# Patient Record
Sex: Male | Born: 1975 | Race: White | Hispanic: No | Marital: Single | Smoking: Current every day smoker
Health system: Southern US, Community
[De-identification: ages and names within clinical notes are randomized; demographics above are authoritative.]

## PROBLEM LIST (undated history)

## (undated) DIAGNOSIS — J45909 Unspecified asthma, uncomplicated: Secondary | ICD-10-CM

---

## 1998-03-19 ENCOUNTER — Emergency Department (HOSPITAL_COMMUNITY): Admission: EM | Admit: 1998-03-19 | Discharge: 1998-03-19 | Payer: Self-pay | Admitting: Emergency Medicine

## 2002-01-02 ENCOUNTER — Encounter: Payer: Self-pay | Admitting: Emergency Medicine

## 2002-01-02 ENCOUNTER — Emergency Department (HOSPITAL_COMMUNITY): Admission: EM | Admit: 2002-01-02 | Discharge: 2002-01-02 | Payer: Self-pay | Admitting: Emergency Medicine

## 2005-04-18 ENCOUNTER — Emergency Department: Payer: Self-pay | Admitting: Emergency Medicine

## 2005-11-05 ENCOUNTER — Emergency Department (HOSPITAL_COMMUNITY): Admission: EM | Admit: 2005-11-05 | Discharge: 2005-11-06 | Payer: Self-pay | Admitting: Emergency Medicine

## 2007-07-31 ENCOUNTER — Emergency Department (HOSPITAL_COMMUNITY): Admission: EM | Admit: 2007-07-31 | Discharge: 2007-07-31 | Payer: Self-pay | Admitting: Emergency Medicine

## 2010-02-16 ENCOUNTER — Emergency Department (HOSPITAL_COMMUNITY): Admission: EM | Admit: 2010-02-16 | Discharge: 2010-02-17 | Payer: Self-pay | Admitting: Emergency Medicine

## 2010-02-23 ENCOUNTER — Emergency Department (HOSPITAL_COMMUNITY): Admission: EM | Admit: 2010-02-23 | Discharge: 2010-02-23 | Payer: Self-pay | Admitting: Emergency Medicine

## 2010-03-18 ENCOUNTER — Emergency Department (HOSPITAL_COMMUNITY): Admission: EM | Admit: 2010-03-18 | Discharge: 2010-03-18 | Payer: Self-pay | Admitting: Emergency Medicine

## 2010-05-31 ENCOUNTER — Emergency Department (HOSPITAL_COMMUNITY): Admission: EM | Admit: 2010-05-31 | Discharge: 2010-06-01 | Payer: Self-pay | Admitting: Emergency Medicine

## 2010-06-14 ENCOUNTER — Emergency Department (HOSPITAL_COMMUNITY): Admission: EM | Admit: 2010-06-14 | Discharge: 2010-06-14 | Payer: Self-pay | Admitting: Emergency Medicine

## 2010-07-16 ENCOUNTER — Inpatient Hospital Stay (HOSPITAL_COMMUNITY): Admission: EM | Admit: 2010-07-16 | Discharge: 2010-07-18 | Payer: Self-pay

## 2010-10-03 ENCOUNTER — Emergency Department (HOSPITAL_COMMUNITY)
Admission: EM | Admit: 2010-10-03 | Discharge: 2010-10-04 | Payer: Self-pay | Source: Home / Self Care | Admitting: Emergency Medicine

## 2011-01-03 LAB — BASIC METABOLIC PANEL
BUN: 10 mg/dL (ref 6–23)
BUN: 8 mg/dL (ref 6–23)
Calcium: 8 mg/dL — ABNORMAL LOW (ref 8.4–10.5)
Chloride: 108 mEq/L (ref 96–112)
Creatinine, Ser: 0.8 mg/dL (ref 0.4–1.5)
Potassium: 4.2 mEq/L (ref 3.5–5.1)
Sodium: 140 mEq/L (ref 135–145)

## 2011-01-03 LAB — CBC
HCT: 44.8 % (ref 39.0–52.0)
HCT: 47.3 % (ref 39.0–52.0)
Hemoglobin: 16.8 g/dL (ref 13.0–17.0)
MCH: 32.2 pg (ref 26.0–34.0)
MCH: 32.7 pg (ref 26.0–34.0)
MCV: 92 fL (ref 78.0–100.0)
Platelets: 211 10*3/uL (ref 150–400)
Platelets: 227 10*3/uL (ref 150–400)
RBC: 5.14 MIL/uL (ref 4.22–5.81)
RDW: 12.6 % (ref 11.5–15.5)

## 2011-01-03 LAB — DIFFERENTIAL
Eosinophils Absolute: 0.2 10*3/uL (ref 0.0–0.7)
Eosinophils Relative: 3 % (ref 0–5)
Neutrophils Relative %: 56 % (ref 43–77)

## 2011-01-03 LAB — ETHANOL: Alcohol, Ethyl (B): 280 mg/dL — ABNORMAL HIGH (ref 0–10)

## 2011-01-04 LAB — GLUCOSE, CAPILLARY: Glucose-Capillary: 100 mg/dL — ABNORMAL HIGH (ref 70–99)

## 2011-05-23 ENCOUNTER — Emergency Department (HOSPITAL_COMMUNITY): Payer: Self-pay

## 2011-05-23 ENCOUNTER — Emergency Department (HOSPITAL_COMMUNITY)
Admission: EM | Admit: 2011-05-23 | Discharge: 2011-05-23 | Disposition: A | Discharge status: Home / Self Care | Payer: Self-pay | Attending: Emergency Medicine | Admitting: Emergency Medicine

## 2011-05-23 DIAGNOSIS — S42009A Fracture of unspecified part of unspecified clavicle, initial encounter for closed fracture: Secondary | ICD-10-CM | POA: Insufficient documentation

## 2011-05-23 DIAGNOSIS — M25519 Pain in unspecified shoulder: Secondary | ICD-10-CM | POA: Insufficient documentation

## 2011-05-23 DIAGNOSIS — R51 Headache: Secondary | ICD-10-CM | POA: Insufficient documentation

## 2011-05-23 DIAGNOSIS — IMO0002 Reserved for concepts with insufficient information to code with codable children: Secondary | ICD-10-CM | POA: Insufficient documentation

## 2011-08-24 IMAGING — CT CT HEAD W/O CM
1 of 2 series · 16 of 30 positions shown, 20 images · non-contrast
Comparison: 07/15/2010.

CLINICAL DATA: Traumatic brain injury.  Subarachnoid hemorrhage.
Cervical strain.

CT HEAD WITHOUT CONTRAST
TECHNIQUE: Contiguous axial images were obtained from the base of
the skull through the vertex without contrast.

[Series 3: recon 2: brain · axial · 0.47mm/px · z∈[+130,+265]mm · 16 of 64 slices shown, 20 images]
[im 4/64  brain]
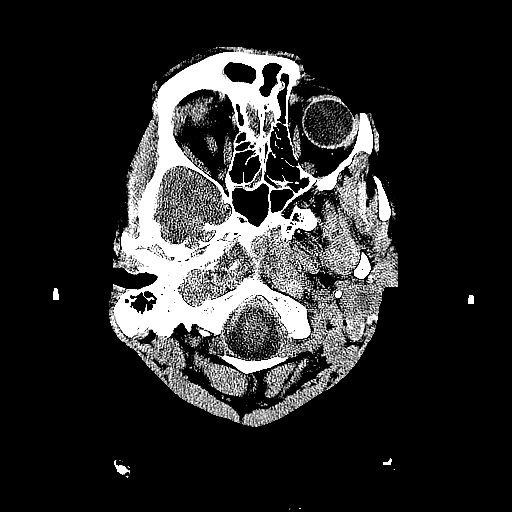
[im 4/64  bone]
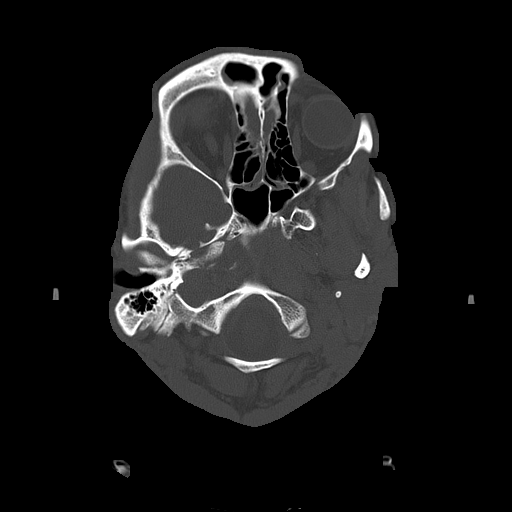
[im 7/64  brain]
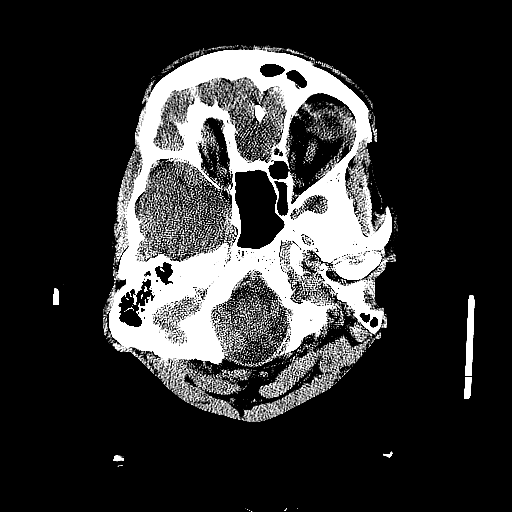
[im 10/64  brain]
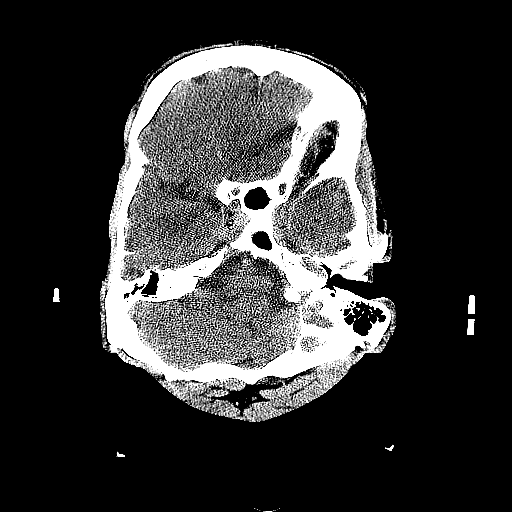
[im 14/64  brain]
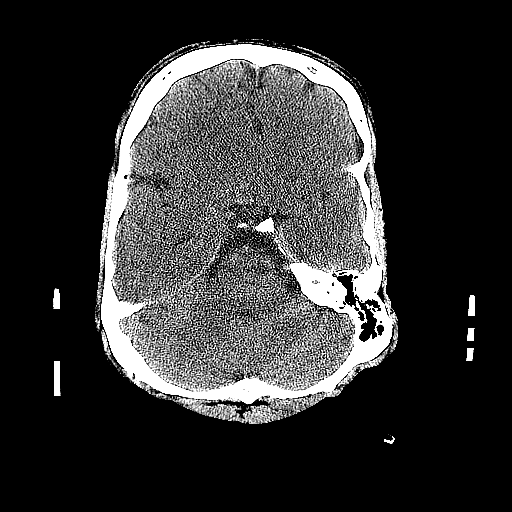
[im 17/64  brain]
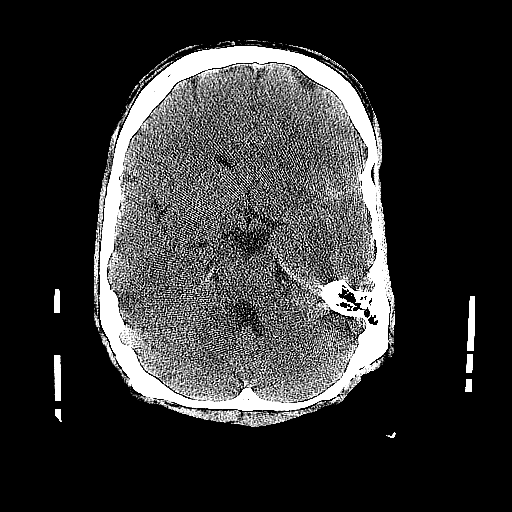
[im 17/64  bone]
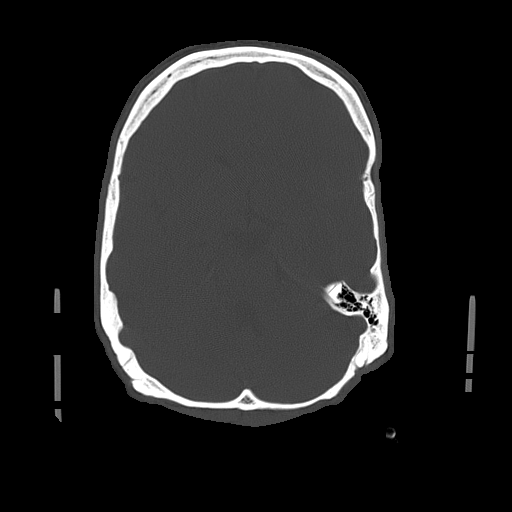
[im 20/64  brain]
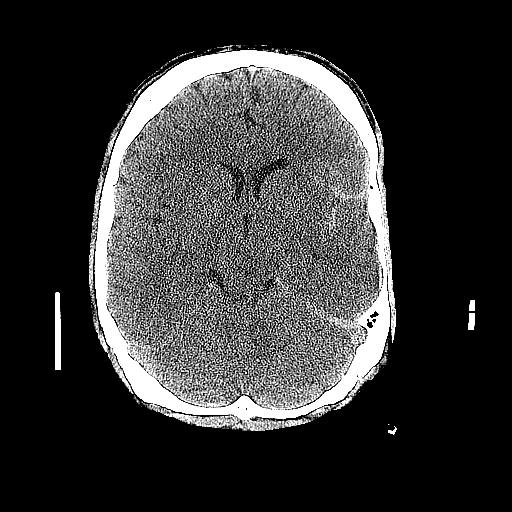
[im 24/64  brain]
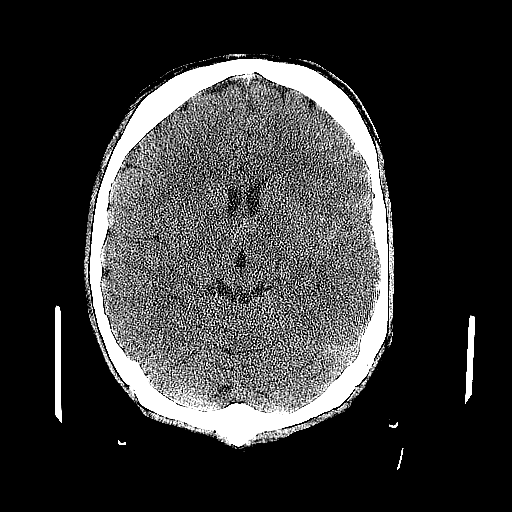
[im 27/64  brain]
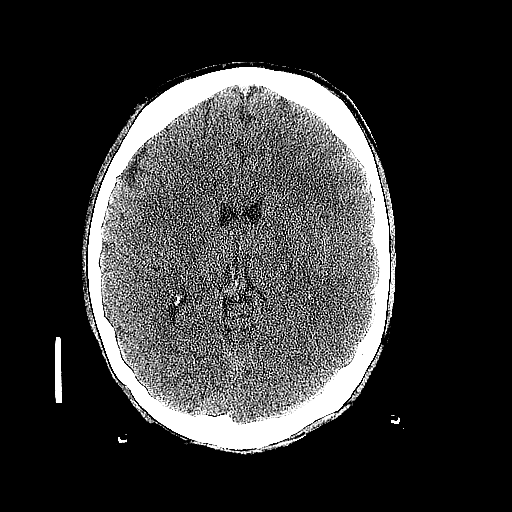
[im 34/64  brain]
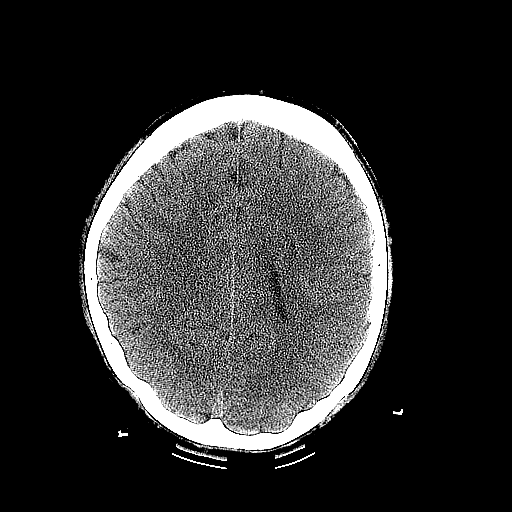
[im 34/64  bone]
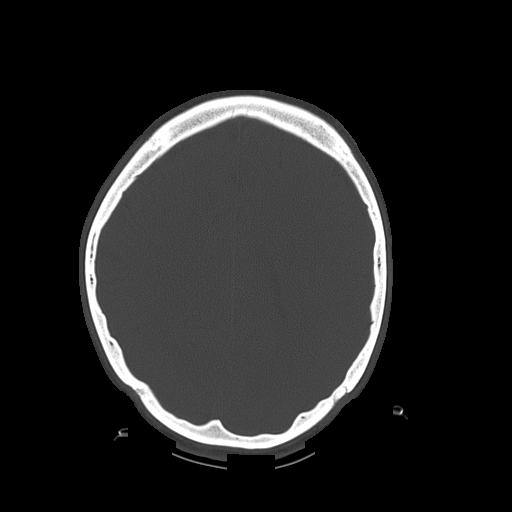
[im 37/64  brain]
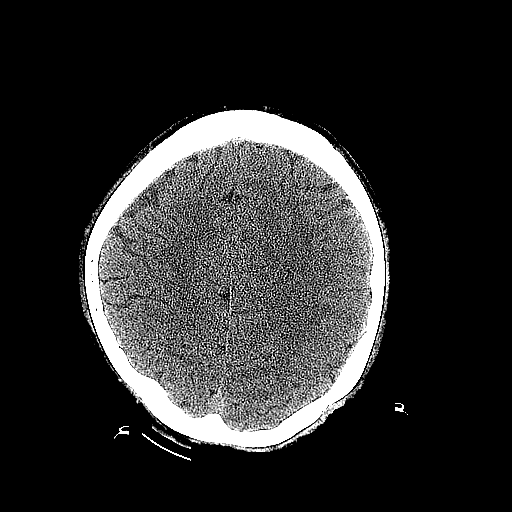
[im 40/64  brain]
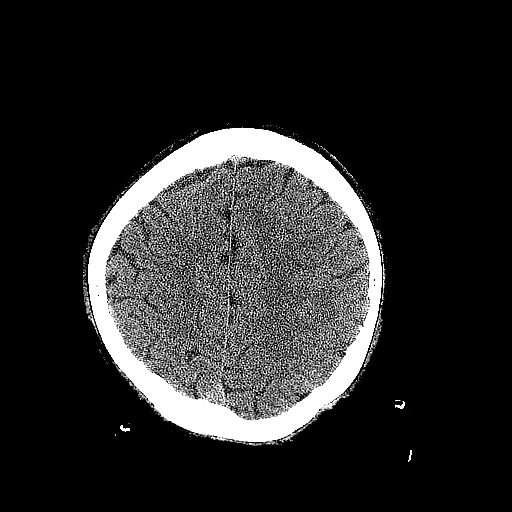
[im 44/64  brain]
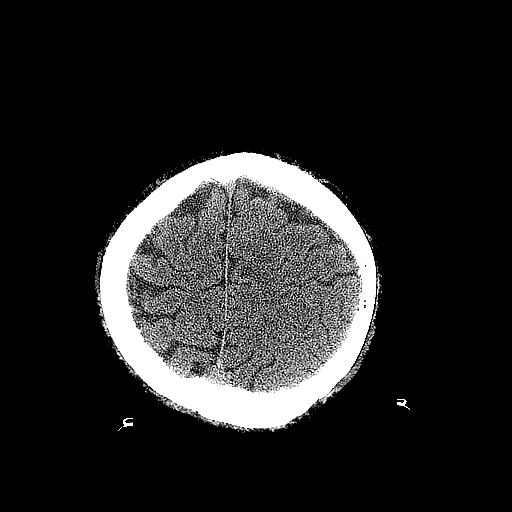
[im 47/64  brain]
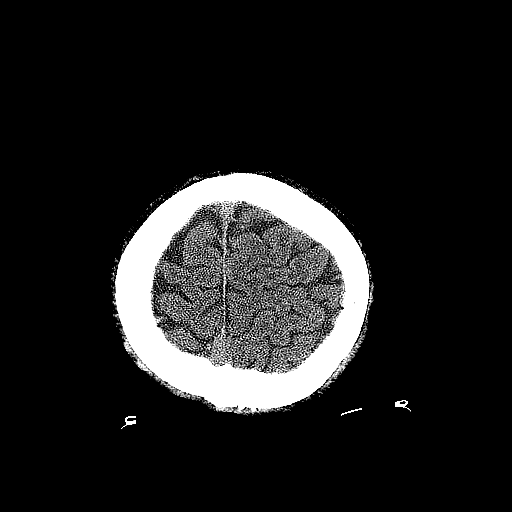
[im 47/64  bone]
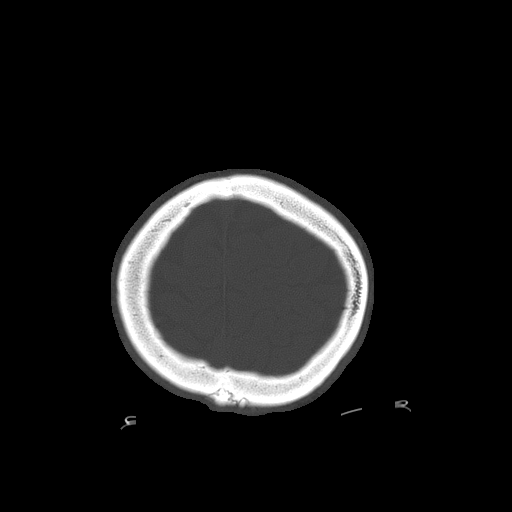
[im 50/64  brain]
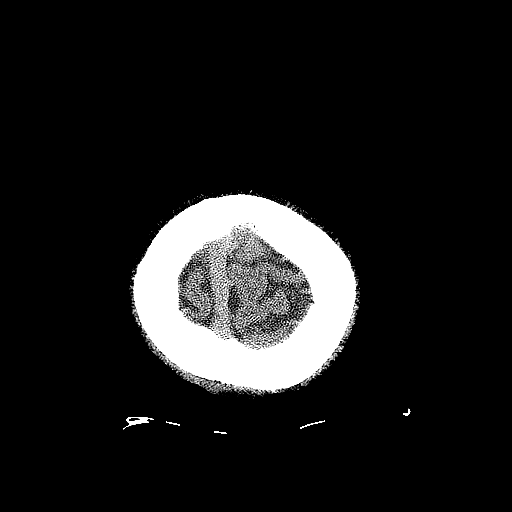
[im 54/64  brain]
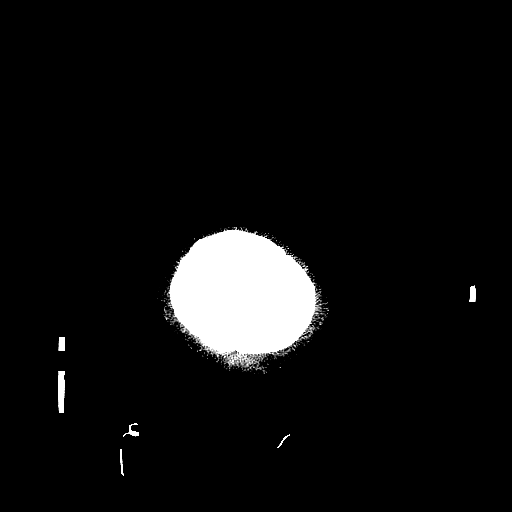
[im 57/64  brain]
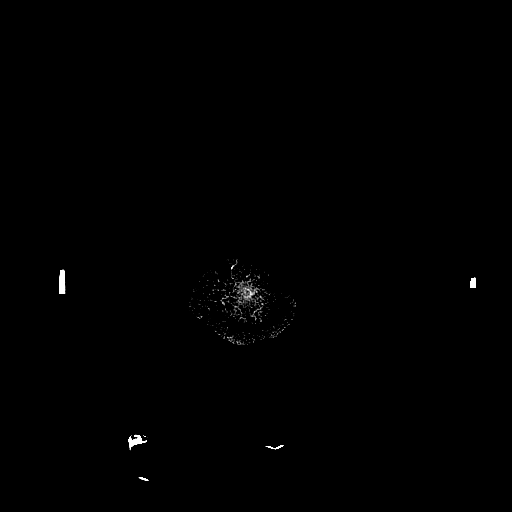

[16 of 30 positions shown; findings below may reference images not displayed]

FINDINGS: No interval change in left frontal and temporal
subarachnoid hemorrhage.  No hydrocephalus.  High attenuation is
present along the left tentorium, suggesting a small amount of
subdural hemorrhage.  Falx appears within normal limits.  No mass
lesion or mass effect.  No midline shift.  No skull fracture.
Scattered ethmoid sinus disease.
IMPRESSION: Unchanged left frontal and temporal subarachnoid hemorrhage.  High
attenuation has developed along the left tentorium, suggesting a
small amount of subdural hemorrhage.  No mass effect.

## 2011-08-26 IMAGING — CT CT HEAD W/O CM
2 series · 16 of 30 positions shown, 20 images · non-contrast
Comparison: [DATE]

CLINICAL DATA: Follow-up intracranial hemorrhage.  Closed head
injury.

CT HEAD WITHOUT CONTRAST
TECHNIQUE: Contiguous axial images were obtained from the base of
the skull through the vertex without contrast.

[Series 2: head w/o · axial · non-contrast · 0.41mm/px · z∈[+102,+222]mm · 13 of 30 slices shown, 17 images]
[im 3/30  brain]
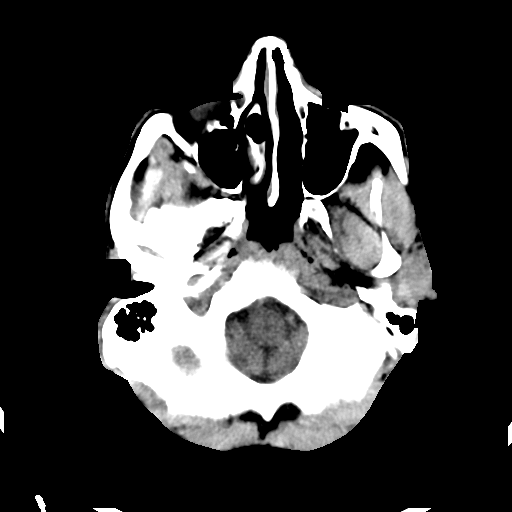
[im 3/30  bone]
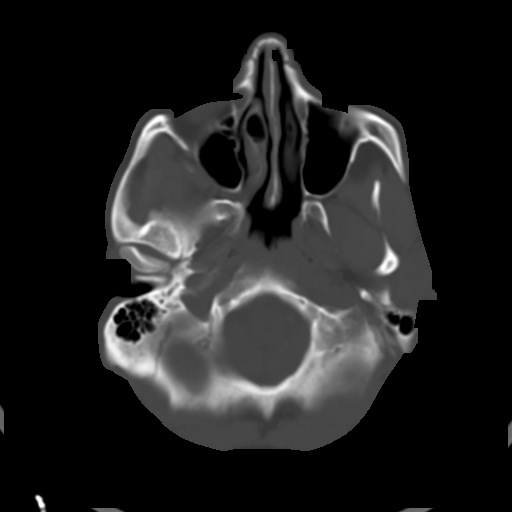
[im 5/30  brain]
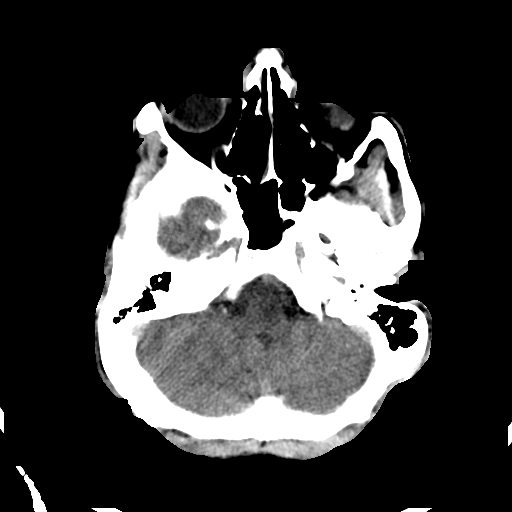
[im 7/30  brain]
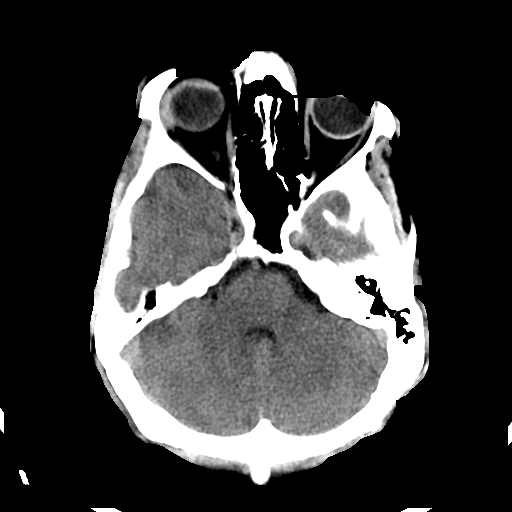
[im 9/30  brain]
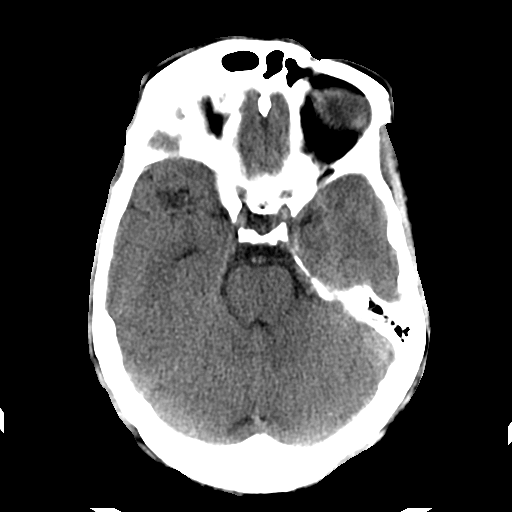
[im 11/30  brain]
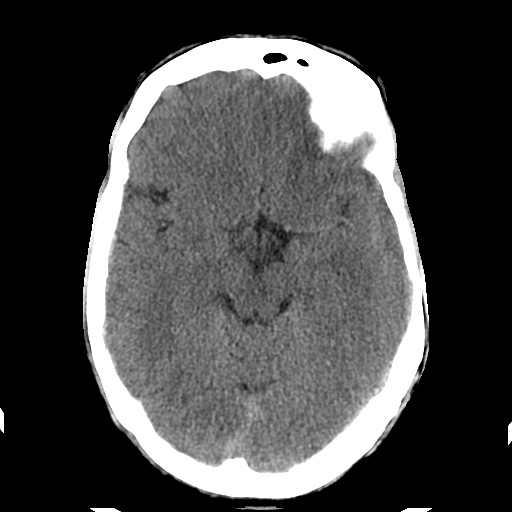
[im 11/30  bone]
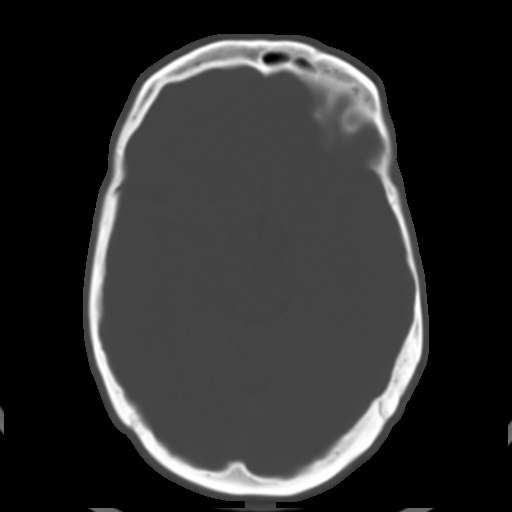
[im 13/30  brain]
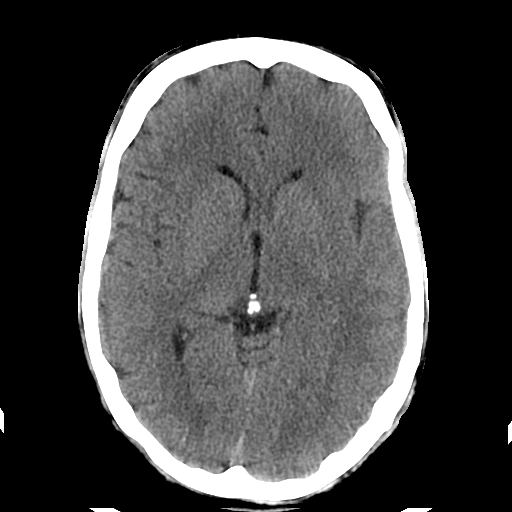
[im 15/30  brain]
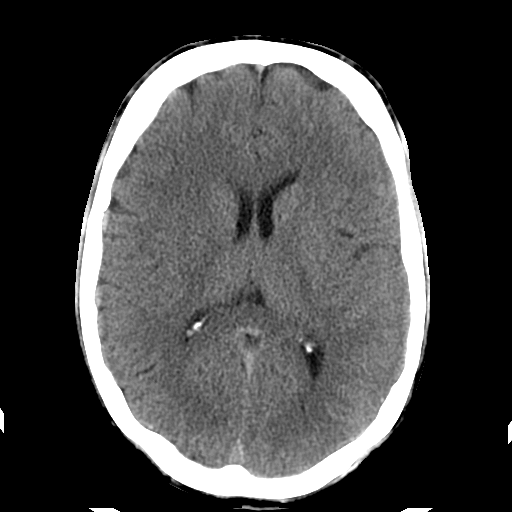
[im 17/30  brain]
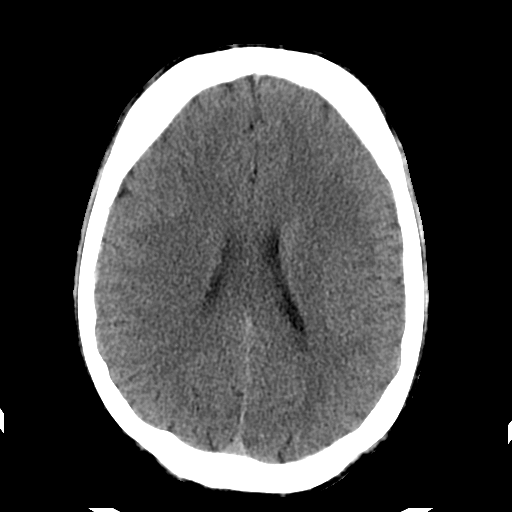
[im 19/30  brain]
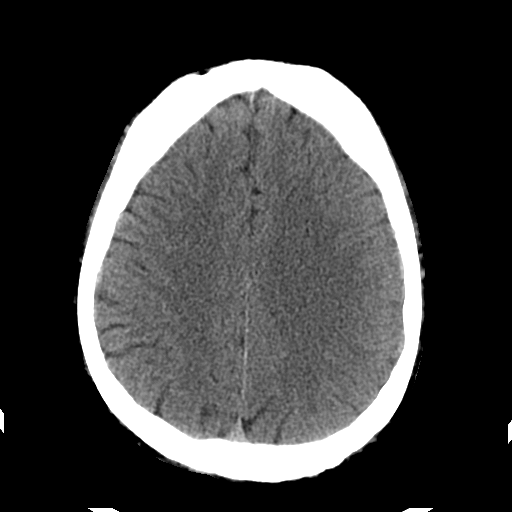
[im 19/30  bone]
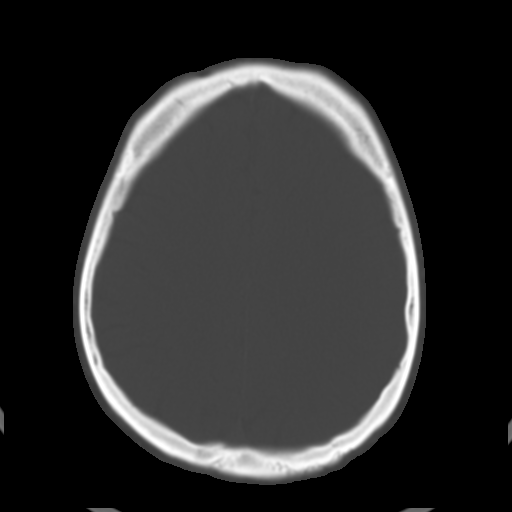
[im 21/30  brain]
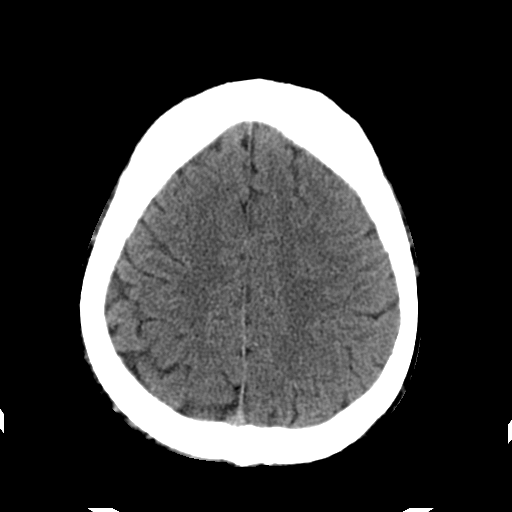
[im 23/30  brain]
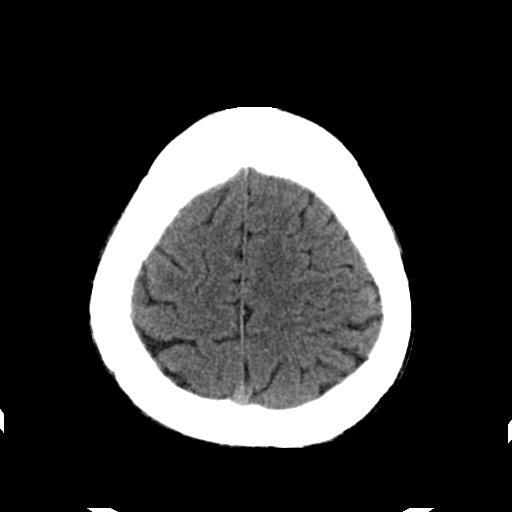
[im 25/30  brain]
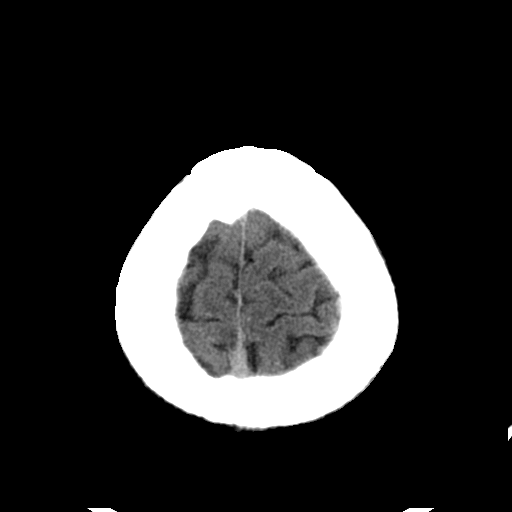
[im 27/30  brain]
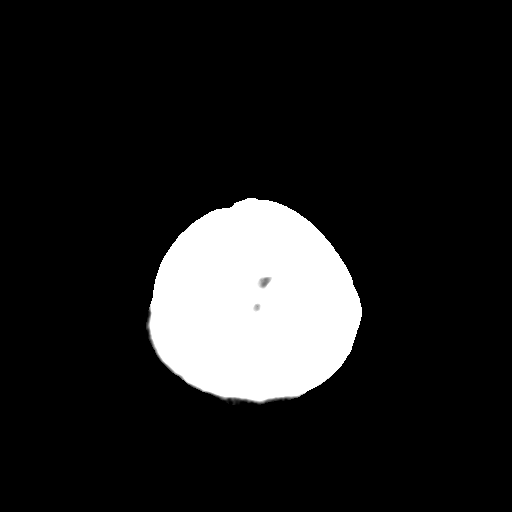
[im 27/30  bone]
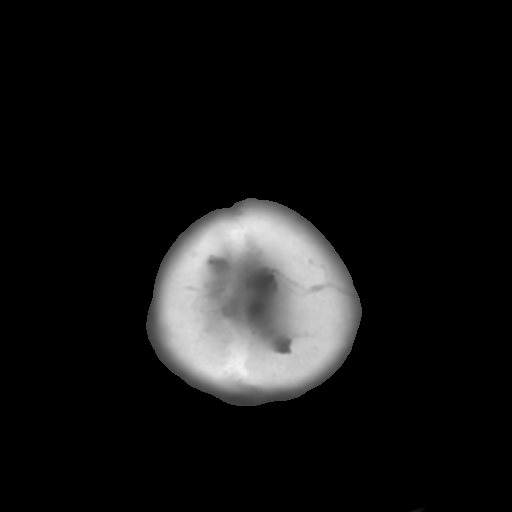

[Series 3: head w/o bone · axial · non-contrast · 0.41mm/px · z∈[+102,+142]mm · 3 of 30 slices shown]
[im 3/30  bone]
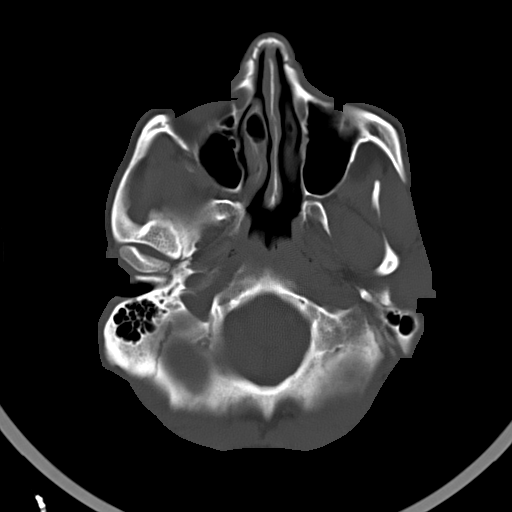
[im 7/30  bone]
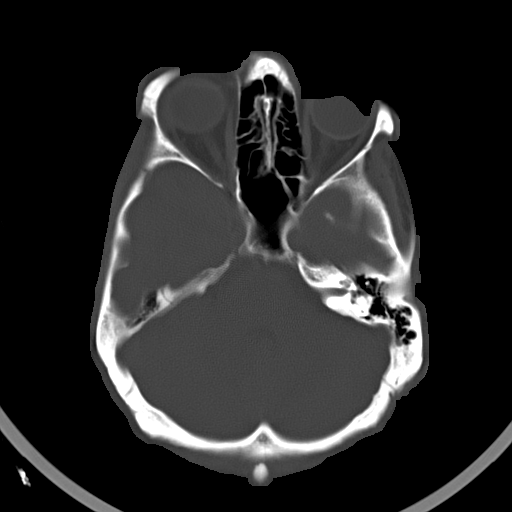
[im 11/30  bone]
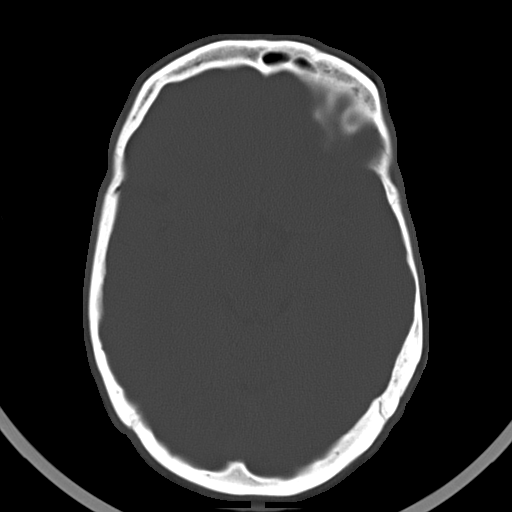

[16 of 30 positions shown; findings below may reference images not displayed]

FINDINGS: Hyperdense subarachnoid blood previously seen is no
longer detectable.  No visible subdural blood.  The brain does not
show swelling.  No focal infarction, mass lesion, hydrocephalus or
extra-axial collection.  No skull fracture.  Sinuses, middle ears
and mastoids are clear.
IMPRESSION: Normalization of the head CT.  Previously seen traumatic
subarachnoid hemorrhage on the left is no longer visible.  No
discernible subdural hematoma.

## 2013-04-14 ENCOUNTER — Emergency Department (HOSPITAL_COMMUNITY)
Admission: EM | Admit: 2013-04-14 | Discharge: 2013-04-15 | Discharge status: Against Medical Advice | Payer: Self-pay | Attending: Emergency Medicine | Admitting: Emergency Medicine

## 2013-04-14 ENCOUNTER — Emergency Department (HOSPITAL_COMMUNITY): Payer: Self-pay

## 2013-04-14 ENCOUNTER — Encounter (HOSPITAL_COMMUNITY): Payer: Self-pay | Admitting: Emergency Medicine

## 2013-04-14 DIAGNOSIS — R0989 Other specified symptoms and signs involving the circulatory and respiratory systems: Secondary | ICD-10-CM | POA: Insufficient documentation

## 2013-04-14 DIAGNOSIS — R630 Anorexia: Secondary | ICD-10-CM | POA: Insufficient documentation

## 2013-04-14 DIAGNOSIS — J988 Other specified respiratory disorders: Secondary | ICD-10-CM

## 2013-04-14 DIAGNOSIS — R5381 Other malaise: Secondary | ICD-10-CM | POA: Insufficient documentation

## 2013-04-14 DIAGNOSIS — B9789 Other viral agents as the cause of diseases classified elsewhere: Secondary | ICD-10-CM | POA: Insufficient documentation

## 2013-04-14 DIAGNOSIS — R5383 Other fatigue: Secondary | ICD-10-CM | POA: Insufficient documentation

## 2013-04-14 DIAGNOSIS — R0609 Other forms of dyspnea: Secondary | ICD-10-CM | POA: Insufficient documentation

## 2013-04-14 DIAGNOSIS — J989 Respiratory disorder, unspecified: Secondary | ICD-10-CM | POA: Insufficient documentation

## 2013-04-14 DIAGNOSIS — R509 Fever, unspecified: Secondary | ICD-10-CM | POA: Insufficient documentation

## 2013-04-14 DIAGNOSIS — R0602 Shortness of breath: Secondary | ICD-10-CM | POA: Insufficient documentation

## 2013-04-14 LAB — CBC WITH DIFFERENTIAL/PLATELET
Eosinophils Absolute: 0 10*3/uL (ref 0.0–0.7)
Eosinophils Relative: 0 % (ref 0–5)
Hemoglobin: 14.8 g/dL (ref 13.0–17.0)
Lymphs Abs: 1.2 10*3/uL (ref 0.7–4.0)
MCH: 33.8 pg (ref 26.0–34.0)
MCHC: 36.1 g/dL — ABNORMAL HIGH (ref 30.0–36.0)
MCV: 93.6 fL (ref 78.0–100.0)
Monocytes Relative: 10 % (ref 3–12)
RBC: 4.38 MIL/uL (ref 4.22–5.81)

## 2013-04-14 LAB — COMPREHENSIVE METABOLIC PANEL
ALT: 10 U/L (ref 0–53)
AST: 14 U/L (ref 0–37)
Albumin: 3 g/dL — ABNORMAL LOW (ref 3.5–5.2)
Alkaline Phosphatase: 66 U/L (ref 39–117)
CO2: 24 mEq/L (ref 19–32)
GFR calc non Af Amer: >90 mL/min (ref >90)
Total Bilirubin: 0.5 mg/dL (ref 0.3–1.2)

## 2013-04-14 LAB — D-DIMER, QUANTITATIVE: D-Dimer, Quant: <0.27 ug{FEU}/mL (ref 0.00–0.48)

## 2013-04-14 MED ORDER — IPRATROPIUM BROMIDE 0.02 % IN SOLN
0.5000 mg | Freq: Once | RESPIRATORY_TRACT | Status: AC
  Administered 2013-04-14: 0.5 mg via RESPIRATORY_TRACT
  Filled 2013-04-14: qty 2.5

## 2013-04-14 MED ORDER — PREDNISONE 20 MG PO TABS
60.0000 mg | ORAL_TABLET | Freq: Once | ORAL | Status: AC
  Administered 2013-04-14: 60 mg via ORAL
  Filled 2013-04-14: qty 3

## 2013-04-14 MED ORDER — IPRATROPIUM BROMIDE 0.02 % IN SOLN: 0.5000 mg | Freq: Two times a day (BID) | RESPIRATORY_TRACT | Status: DC

## 2013-04-14 MED ORDER — SODIUM CHLORIDE 0.9 % IV BOLUS (SEPSIS)
1000.0000 mL | Freq: Once | INTRAVENOUS | Status: AC
  Administered 2013-04-14: 1000 mL via INTRAVENOUS

## 2013-04-14 MED ORDER — ALBUTEROL SULFATE (5 MG/ML) 0.5% IN NEBU: 2.5000 mg | INHALATION_SOLUTION | Freq: Two times a day (BID) | RESPIRATORY_TRACT | Status: DC

## 2013-04-14 MED ORDER — ALBUTEROL SULFATE (5 MG/ML) 0.5% IN NEBU
5.0000 mg | INHALATION_SOLUTION | Freq: Once | RESPIRATORY_TRACT | Status: AC
  Administered 2013-04-14: 5 mg via RESPIRATORY_TRACT
  Filled 2013-04-14: qty 1

## 2013-04-14 MED ORDER — ACETAMINOPHEN 325 MG PO TABS
650.0000 mg | ORAL_TABLET | Freq: Once | ORAL | Status: AC
  Administered 2013-04-14: 650 mg via ORAL
  Filled 2013-04-14: qty 2

## 2013-04-14 MED ORDER — ONDANSETRON HCL 4 MG/2ML IJ SOLN
4.0000 mg | Freq: Once | INTRAMUSCULAR | Status: AC
  Administered 2013-04-14: 4 mg via INTRAVENOUS
  Filled 2013-04-14: qty 2

## 2013-04-14 MED ORDER — ALBUTEROL (5 MG/ML) CONTINUOUS INHALATION SOLN
10.0000 mg/h | INHALATION_SOLUTION | RESPIRATORY_TRACT | Status: AC
  Administered 2013-04-15: 10 mg/h via RESPIRATORY_TRACT
  Filled 2013-04-14: qty 20

## 2013-04-14 NOTE — ED Notes (Signed)
Dr. Rancour at bedside. 

## 2013-04-14 NOTE — ED Provider Notes (Signed)
Patient care assumed from Dr. Jeannett Senior Rancour at shift change with labs pending and plan to d/c if heart rate and SpO2 improve.  On my examination, patient is well and nontoxic appearing without tachypnea or dyspnea; heart RRR at rate of 98 and lungs with diffuse adventitious sounds and mild expiratory wheezes in LULF. Labs significant for mild leukocytosis of 16.6 consistent with viral process as chest x-ray shows no signs of pneumonia, pleural effusion, pneumothorax, or other acute cardiopulmonary changes. Troponin less than 0.30 and d-dimer within normal limits. Will administer 2nd neb tx, ambulate, and reassess.  Patient's SpO2 dropped to 89-92% with ambulation and tachy to 108. Will order continuous neb and reassess.  After continuous neb, pulse ox between 84-92% with ambulation. Have spoken to the patient regarding admission given hypoxia while ambulating. Patient states that he does not wish to be admitted and wants to go home. Have discussed with the patient that this is against my medical advice as I believe the patient requires admission for pulse ox monitoring and further breathing treatments. Patient still insistent on discharge; patient verbalizes understanding when told that premature discharge may result in adverse outcomes including, but not limited to, syncope or outcomes as severe as death. Patient again declines admission and states he would like to leave AMA. Albuterol inhaler and Rx for prednisone and doxycycline given.    Date: 04/14/2013  Rate: 129  Rhythm: sinus tachycardia  QRS Axis: normal  Intervals: normal  ST/T Wave abnormalities: nonspecific ST changes  Conduction Disutrbances:abnormal R progression in V2  Narrative Interpretation: Sinus tachycardia with nonspecific ST changes (II, III, aVR, aVL, aVF); no STEMI  Old EKG Reviewed: none available I have personally reviewed and interpreted this EKG    Results for orders placed during the hospital encounter of 04/14/13   CBC WITH DIFFERENTIAL      Result Value Range   WBC 16.6 (*) 4.0 - 10.5 K/uL   RBC 4.38  4.22 - 5.81 MIL/uL   Hemoglobin 14.8  13.0 - 17.0 g/dL   HCT 16.1  09.6 - 04.5 %   MCV 93.6  78.0 - 100.0 fL   MCH 33.8  26.0 - 34.0 pg   MCHC 36.1 (*) 30.0 - 36.0 g/dL   RDW 40.9  81.1 - 91.4 %   Platelets 141 (*) 150 - 400 K/uL   Neutrophils Relative % 83 (*) 43 - 77 %   Neutro Abs 13.7 (*) 1.7 - 7.7 K/uL   Lymphocytes Relative 7 (*) 12 - 46 %   Lymphs Abs 1.2  0.7 - 4.0 K/uL   Monocytes Relative 10  3 - 12 %   Monocytes Absolute 1.6 (*) 0.1 - 1.0 K/uL   Eosinophils Relative 0  0 - 5 %   Eosinophils Absolute 0.0  0.0 - 0.7 K/uL   Basophils Relative 0  0 - 1 %   Basophils Absolute 0.0  0.0 - 0.1 K/uL  COMPREHENSIVE METABOLIC PANEL      Result Value Range   Sodium 135  135 - 145 mEq/L   Potassium 3.4 (*) 3.5 - 5.1 mEq/L   Chloride 102  96 - 112 mEq/L   CO2 24  19 - 32 mEq/L   Glucose, Bld 135 (*) 70 - 99 mg/dL   BUN 7  6 - 23 mg/dL   Creatinine, Ser 7.82  0.50 - 1.35 mg/dL   Calcium 8.0 (*) 8.4 - 10.5 mg/dL   Total Protein 5.6 (*) 6.0 - 8.3 g/dL  Albumin 3.0 (*) 3.5 - 5.2 g/dL   AST 14  0 - 37 U/L   ALT 10  0 - 53 U/L   Alkaline Phosphatase 66  39 - 117 U/L   Total Bilirubin 0.5  0.3 - 1.2 mg/dL   GFR calc non Af Amer >90  >90 mL/min   GFR calc Af Amer >90  >90 mL/min  TROPONIN I      Result Value Range   Troponin I <0.30  <0.30 ng/mL  D-DIMER, QUANTITATIVE      Result Value Range   D-Dimer, Quant <0.27  0.00 - 0.48 ug/mL-FEU   Dg Chest 2 View  04/14/2013   *RADIOLOGY REPORT*  Clinical Data: Shortness of breath, chest pain, smoker  CHEST - 2 VIEW  Comparison: 07/15/2010  Findings: Mild chronic bronchitic changes centrally.  No focal pneumonia, collapse, consolidation, edema, effusion or pneumothorax.  Trachea midline.  Healed left clavicle fracture.  IMPRESSION: Chronic bronchitic changes.  No focal pneumonia.   Original Report Authenticated By: Judie Petit. Miles Costain, M.D.      [place], [place] 04/15/13 [phone]

## 2013-04-14 NOTE — ED Provider Notes (Signed)
History    CSN: [id number] Arrival date & time 04/14/13  6578  First MD Initiated Contact with Patient 04/14/13 1838     Chief Complaint  Patient presents with  . Cough   (Consider location/radiation/quality/duration/timing/severity/associated sxs/prior Treatment) HPI Comments: Homeless male who presents with a two-day history of feeling warm, cough productive of green sputum, difficulty breathing and pain with coughing. Feels warm to touch. Denies any documented fevers. Tachycardic and febrile on arrival. Denies any illicit drug use. He is alert. He states he is not alcoholic but drinks alcohol every day. Denies any leg pain or swelling. No abdominal pain, nausea vomiting.  The history is provided by the patient and the EMS personnel.   History reviewed. No pertinent past medical history. History reviewed. No pertinent past surgical history. History reviewed. No pertinent family history. History  Substance Use Topics  . Smoking status: Not on file  . Smokeless tobacco: Not on file  . Alcohol Use: Not on file    Review of Systems  Constitutional: Positive for fever, activity change, appetite change and fatigue.  HENT: Negative for congestion and rhinorrhea.   Respiratory: Positive for cough and shortness of breath. Negative for chest tightness.   Cardiovascular: Negative for chest pain.  Gastrointestinal: Negative for nausea, vomiting and abdominal pain.  Genitourinary: Negative for dysuria and hematuria.  Musculoskeletal: Negative for back pain.  Skin: Negative for rash.  Neurological: Positive for weakness. Negative for dizziness and headaches.  A complete 10 system review of systems was obtained and all systems are negative except as noted in the HPI and PMH.    Allergies  Shrimp  Home Medications   Current Outpatient Rx  Name  Route  Sig  Dispense  Refill  . Pseudoephedrine-Ibuprofen (ADVIL COLD/SINUS PO)   Oral   Take 1 tablet by mouth daily as needed (for  cold).          BP 105/60  Pulse 91  Temp(Src) 99.7 F (37.6 C) (Oral)  Resp 26  SpO2 92% Physical Exam  Constitutional: He is oriented to person, place, and time. He appears well-developed and well-nourished. He appears distressed.  Mild respiratory distress with increased work of breathing  HENT:  Head: Normocephalic and atraumatic.  Mouth/Throat: Oropharynx is clear and moist. No oropharyngeal exudate.  Eyes: Conjunctivae and EOM are normal. Pupils are equal, round, and reactive to light.  Neck: Normal range of motion. Neck supple.  Cardiovascular: Normal rate and normal heart sounds.   No murmur heard. Pulmonary/Chest: He is in respiratory distress. He has wheezes.  Increased work of breathing rhonchorous, scattered wheezing  Abdominal: Soft. There is no tenderness. There is no rebound and no guarding.  Musculoskeletal: Normal range of motion. He exhibits no edema and no tenderness.  Neurological: He is alert and oriented to person, place, and time. No cranial nerve deficit. He exhibits normal muscle tone. Coordination normal.  Skin: Skin is warm.    ED Course  Procedures (including critical care time) Labs Reviewed  CBC WITH DIFFERENTIAL - Abnormal; Notable for the following:    WBC 16.6 (*)    MCHC 36.1 (*)    Platelets 141 (*)    Neutrophils Relative % 83 (*)    Neutro Abs 13.7 (*)    Lymphocytes Relative 7 (*)    Monocytes Absolute 1.6 (*)    All other components within normal limits  COMPREHENSIVE METABOLIC PANEL - Abnormal; Notable for the following:    Potassium 3.4 (*)    Glucose,  Bld 135 (*)    Calcium 8.0 (*)    Total Protein 5.6 (*)    Albumin 3.0 (*)    All other components within normal limits  TROPONIN I  D-DIMER, QUANTITATIVE   Dg Chest 2 View  04/14/2013   *RADIOLOGY REPORT*  Clinical Data: Shortness of breath, chest pain, smoker  CHEST - 2 VIEW  Comparison: 07/15/2010  Findings: Mild chronic bronchitic changes centrally.  No focal pneumonia,  collapse, consolidation, edema, effusion or pneumothorax.  Trachea midline.  Healed left clavicle fracture.  IMPRESSION: Chronic bronchitic changes.  No focal pneumonia.   Original Report Authenticated By: Judie Petit. Miles Costain, M.D.   No diagnosis found.  MDM  2 days of cough, congestion, SOB with chest tightness. Homeless, smoker, hx alcohol abuse and asthma.  No fever.   Mild distress on arrival, coarse breath sounds, wheezing. Tachycardia to 130s, tachypnea. O2 sat normal.  CXR without focal process.  EKG sinus tach. Troponin and D-dimer negative. Nebs and steroids given., IVF bolus. Antipyretics.  HR improved to 90s, work of breathing improved. Suspect viral process but will cover with doxycycline given smoking history. PA Humes to disposition after second nebulizer and ambulation trial.   Glynn Octave, MD 04/14/13 2214

## 2013-04-14 NOTE — ED Notes (Signed)
Pt c/o coughing up greenish/yellowish sputum since yesterday. Pt had some expiratory wheezing was given 5mg  albuterol tx. Pt states he is having difficulty deep breathing and hurts to cough. Pt was warm to touch but has been outside in the sun all day. Pt had a hx of asthma. Vitals 102/68, 126 hr, 18 resp, 99% RA after tx.

## 2013-04-15 MED ORDER — DOXYCYCLINE HYCLATE 100 MG PO CAPS: 100.0000 mg | ORAL_CAPSULE | Freq: Two times a day (BID) | ORAL | Status: DC

## 2013-04-15 MED ORDER — PREDNISONE 20 MG PO TABS: 40.0000 mg | ORAL_TABLET | Freq: Every day | ORAL | Status: DC

## 2013-04-15 MED ORDER — ALBUTEROL SULFATE HFA 108 (90 BASE) MCG/ACT IN AERS
2.0000 | INHALATION_SPRAY | Freq: Once | RESPIRATORY_TRACT | Status: AC
  Administered 2013-04-15: 2 via RESPIRATORY_TRACT
  Filled 2013-04-15: qty 6.7

## 2013-04-15 NOTE — ED Provider Notes (Signed)
Medical screening examination/treatment/procedure(s) were performed by non-physician practitioner and as supervising physician I was immediately available for consultation/collaboration.   Gilda Crease, MD 04/15/13 [phone]

## 2013-04-15 NOTE — ED Notes (Signed)
Pulse Oximetry while ambulating dropped down 84% and never went above 92%.

## 2013-08-03 ENCOUNTER — Encounter (HOSPITAL_COMMUNITY): Payer: Self-pay | Admitting: Emergency Medicine

## 2013-08-03 ENCOUNTER — Emergency Department (HOSPITAL_COMMUNITY)
Admission: EM | Admit: 2013-08-03 | Discharge: 2013-08-03 | Disposition: A | Discharge status: Home / Self Care | Payer: Self-pay | Attending: Emergency Medicine | Admitting: Emergency Medicine

## 2013-08-03 DIAGNOSIS — K0889 Other specified disorders of teeth and supporting structures: Secondary | ICD-10-CM

## 2013-08-03 DIAGNOSIS — K089 Disorder of teeth and supporting structures, unspecified: Secondary | ICD-10-CM | POA: Insufficient documentation

## 2013-08-03 DIAGNOSIS — F17200 Nicotine dependence, unspecified, uncomplicated: Secondary | ICD-10-CM | POA: Insufficient documentation

## 2013-08-03 MED ORDER — HYDROCODONE-ACETAMINOPHEN 5-325 MG PO TABS: 1.0000 | ORAL_TABLET | Freq: Four times a day (QID) | ORAL | Status: DC | PRN

## 2013-08-03 MED ORDER — OXYCODONE-ACETAMINOPHEN 5-325 MG PO TABS
1.0000 | ORAL_TABLET | Freq: Once | ORAL | Status: AC
  Administered 2013-08-03: 1 via ORAL
  Filled 2013-08-03: qty 1

## 2013-08-03 MED ORDER — PENICILLIN V POTASSIUM 500 MG PO TABS: 500.0000 mg | ORAL_TABLET | Freq: Three times a day (TID) | ORAL | Status: DC

## 2013-08-03 MED ORDER — IBUPROFEN 800 MG PO TABS: 800.0000 mg | ORAL_TABLET | Freq: Three times a day (TID) | ORAL | Status: DC

## 2013-08-03 NOTE — ED Notes (Signed)
Pt discharged.Vital signs stable and GCS 15.Discharge instruction given. 

## 2013-08-03 NOTE — ED Notes (Signed)
Pt. reports left upper dental pain for several days .

## 2013-08-03 NOTE — ED Provider Notes (Signed)
CSN: [id number]     Arrival date & time 08/03/13  2014 History  This chart was scribed for Kent Morn, NP working with Enid Skeens, MD by Carl Best, ED Scribe. This patient was seen in room TR04C/TR04C and the patient's care was started at 10:19 PM.     Chief Complaint  Patient presents with  . Dental Pain    Patient is a 37 y.o. male presenting with tooth pain. The history is provided by the patient. No language interpreter was used.  Dental Pain Associated symptoms: no fever    HPI Comments: Kent Spencer is a 37 y.o. male who presents to the Emergency Department complaining of constant pain to his upper left incisor caused by an abscess that has persisted for three days.  He states that eating and drinking aggravates the pain.  The patient denies fever as an associated symptom.  He states that he is a tobacco user.  The patient denies having a dentist.      History reviewed. No pertinent past medical history. History reviewed. No pertinent past surgical history. No family history on file. History  Substance Use Topics  . Smoking status: Current Every Day Smoker  . Smokeless tobacco: Not on file  . Alcohol Use: No    Review of Systems  Constitutional: Negative for fever.  HENT: Positive for dental problem (upper left incisor).   All other systems reviewed and are negative.    Allergies  Shrimp  Home Medications  No current outpatient prescriptions on file.  Triage Vitals: BP 133/82  Pulse 99  Temp(Src) 98.8 F (37.1 C) (Oral)  Resp 20  Wt 120 lb (54.432 kg)  SpO2 100%  Physical Exam  Nursing note and vitals reviewed. Constitutional: He is oriented to person, place, and time. He appears well-developed and well-nourished. No distress.  HENT:  Head: Normocephalic and atraumatic.  Pain to upper left incisor, widespread tooth decay   Eyes: EOM are normal.  Neck: Neck supple. No tracheal deviation present.  Cardiovascular: Normal rate.    Pulmonary/Chest: Effort normal. No respiratory distress.  Musculoskeletal: Normal range of motion.  Neurological: He is alert and oriented to person, place, and time.  Skin: Skin is warm and dry.  Psychiatric: He has a normal mood and affect. His behavior is normal.    ED Course  Procedures (including critical care time)  DIAGNOSTIC STUDIES: Oxygen Saturation is 100% on room air, normal by my interpretation.    COORDINATION OF CARE: 10:20 PM- Discussed administering antibiotics and antiinflammatory medication in the ED and advised the patient to follow up with a dentist.  The patient agreed to the treatment plan.    Labs Review Labs Reviewed - No data to display Imaging Review No results found.  EKG Interpretation   None     Numerous broken and decayed teeth.  Antibiotics, anti-inflammatory, dental referral information provided.  MDM   Dental pain.  I personally performed the services described in this documentation, which was scribed in my presence. The recorded information has been reviewed and is accurate.    Jimmye Norman, NP 08/03/13 2325

## 2013-08-04 NOTE — ED Provider Notes (Signed)
Medical screening examination/treatment/procedure(s) were performed by non-physician practitioner and as supervising physician I was immediately available for consultation/collaboration.   Enid Skeens, MD 08/04/13 [phone]

## 2014-06-16 ENCOUNTER — Emergency Department (HOSPITAL_COMMUNITY)
Admission: EM | Admit: 2014-06-16 | Discharge: 2014-06-16 | Disposition: A | Discharge status: Home / Self Care | Payer: Self-pay | Attending: Emergency Medicine | Admitting: Emergency Medicine

## 2014-06-16 ENCOUNTER — Emergency Department (HOSPITAL_COMMUNITY): Payer: Self-pay

## 2014-06-16 ENCOUNTER — Encounter (HOSPITAL_COMMUNITY): Payer: Self-pay | Admitting: Emergency Medicine

## 2014-06-16 DIAGNOSIS — Z791 Long term (current) use of non-steroidal anti-inflammatories (NSAID): Secondary | ICD-10-CM | POA: Insufficient documentation

## 2014-06-16 DIAGNOSIS — S0180XA Unspecified open wound of other part of head, initial encounter: Secondary | ICD-10-CM | POA: Insufficient documentation

## 2014-06-16 DIAGNOSIS — Z23 Encounter for immunization: Secondary | ICD-10-CM | POA: Insufficient documentation

## 2014-06-16 DIAGNOSIS — Z792 Long term (current) use of antibiotics: Secondary | ICD-10-CM | POA: Insufficient documentation

## 2014-06-16 DIAGNOSIS — S0120XA Unspecified open wound of nose, initial encounter: Secondary | ICD-10-CM | POA: Insufficient documentation

## 2014-06-16 DIAGNOSIS — S0181XA Laceration without foreign body of other part of head, initial encounter: Secondary | ICD-10-CM

## 2014-06-16 DIAGNOSIS — F17200 Nicotine dependence, unspecified, uncomplicated: Secondary | ICD-10-CM | POA: Insufficient documentation

## 2014-06-16 DIAGNOSIS — S022XXA Fracture of nasal bones, initial encounter for closed fracture: Secondary | ICD-10-CM | POA: Insufficient documentation

## 2014-06-16 DIAGNOSIS — S0121XA Laceration without foreign body of nose, initial encounter: Secondary | ICD-10-CM

## 2014-06-16 DIAGNOSIS — R062 Wheezing: Secondary | ICD-10-CM | POA: Insufficient documentation

## 2014-06-16 DIAGNOSIS — S61209A Unspecified open wound of unspecified finger without damage to nail, initial encounter: Secondary | ICD-10-CM | POA: Insufficient documentation

## 2014-06-16 MED ORDER — TETANUS-DIPHTH-ACELL PERTUSSIS 5-2.5-18.5 LF-MCG/0.5 IM SUSP
0.5000 mL | Freq: Once | INTRAMUSCULAR | Status: AC
  Administered 2014-06-16: 0.5 mL via INTRAMUSCULAR
  Filled 2014-06-16: qty 0.5

## 2014-06-16 MED ORDER — LIDOCAINE HCL 1 % IJ SOLN
INTRAMUSCULAR | Status: AC
  Administered 2014-06-16: 10 mL
  Filled 2014-06-16: qty 20

## 2014-06-16 NOTE — ED Notes (Signed)
MD at bedside.  Theron Arista EDPA

## 2014-06-16 NOTE — ED Provider Notes (Signed)
Medical screening examination/treatment/procedure(s) were performed by non-physician practitioner and as supervising physician I was immediately available for consultation/collaboration.   EKG Interpretation None       Corlette Ciano M Keifer Habib, MD 06/16/14 0624 
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
## 2014-06-16 NOTE — Discharge Instructions (Signed)
Keep your wounds clean and dry. Have your sutures removed in 5-7 days. Return sooner if you have any changing or worsening symptoms.   Facial Laceration  A facial laceration is a cut on the face. These injuries can be painful and cause bleeding. Lacerations usually heal quickly, but they need special care to reduce scarring. DIAGNOSIS  Your health care provider will take a medical history, ask for details about how the injury occurred, and examine the wound to determine how deep the cut is. TREATMENT  Some facial lacerations may not require closure. Others may not be able to be closed because of an increased risk of infection. The risk of infection and the chance for successful closure will depend on various factors, including the amount of time since the injury occurred. The wound may be cleaned to help prevent infection. If closure is appropriate, pain medicines may be given if needed. Your health care provider will use stitches (sutures), wound glue (adhesive), or skin adhesive strips to repair the laceration. These tools bring the skin edges together to allow for faster healing and a better cosmetic outcome. If needed, you may also be given a tetanus shot. HOME CARE INSTRUCTIONS  Only take over-the-counter or prescription medicines as directed by your health care provider.  Follow your health care provider's instructions for wound care. These instructions will vary depending on the technique used for closing the wound. For Sutures:  Keep the wound clean and dry.   If you were given a bandage (dressing), you should change it at least once a day. Also change the dressing if it becomes wet or dirty, or as directed by your health care provider.   Wash the wound with soap and water 2 times a day. Rinse the wound off with water to remove all soap. Pat the wound dry with a clean towel.   After cleaning, apply a thin layer of the antibiotic ointment recommended by your health care provider.  This will help prevent infection and keep the dressing from sticking.   You may shower as usual after the first 24 hours. Do not soak the wound in water until the sutures are removed.   Get your sutures removed as directed by your health care provider. With facial lacerations, sutures should usually be taken out after 4-5 days to avoid stitch marks.   Wait a few days after your sutures are removed before applying any makeup. For Skin Adhesive Strips:  Keep the wound clean and dry.   Do not get the skin adhesive strips wet. You may bathe carefully, using caution to keep the wound dry.   If the wound gets wet, pat it dry with a clean towel.   Skin adhesive strips will fall off on their own. You may trim the strips as the wound heals. Do not remove skin adhesive strips that are still stuck to the wound. They will fall off in time.  For Wound Adhesive:  You may briefly wet your wound in the shower or bath. Do not soak or scrub the wound. Do not swim. Avoid periods of heavy sweating until the skin adhesive has fallen off on its own. After showering or bathing, gently pat the wound dry with a clean towel.   Do not apply liquid medicine, cream medicine, ointment medicine, or makeup to your wound while the skin adhesive is in place. This may loosen the film before your wound is healed.   If a dressing is placed over the wound, be careful not  to apply tape directly over the skin adhesive. This may cause the adhesive to be pulled off before the wound is healed.   Avoid prolonged exposure to sunlight or tanning lamps while the skin adhesive is in place.  The skin adhesive will usually remain in place for 5-10 days, then naturally fall off the skin. Do not pick at the adhesive film.  After Healing: Once the wound has healed, cover the wound with sunscreen during the day for 1 full year. This can help minimize scarring. Exposure to ultraviolet light in the first year will darken the scar.  It can take 1-2 years for the scar to lose its redness and to heal completely.  SEEK IMMEDIATE MEDICAL CARE IF:  You have redness, pain, or swelling around the wound.   You see ayellowish-white fluid (pus) coming from the wound.   You have chills or a fever.  MAKE SURE YOU:  Understand these instructions.  Will watch your condition.  Will get help right away if you are not doing well or get worse. Document Released: 11/14/2004 Document Revised: 07/28/2013 Document Reviewed: 05/20/2013 [facility] Patient Information 2015 [place], [place]. This information is not intended to replace advice given to you by your health care provider. Make sure you discuss any questions you have with your health care provider.    Nasal Fracture A fracture is a break in the bone. A nasal fracture is a broken nose. Minor breaks do not need treatment. Serious breaks may need surgery.  HOME CARE  Put ice on the injured area.  Put ice in a plastic bag.  Place a towel between your skin and the bag.  Leave the ice on for 15-20 minutes, 03-04 times a day.  Only take medicine as told by your doctor.  If your nose bleeds, squeeze your nose shut gently. Sit upright for 10 minutes.  Do not play contact sports for 3 to 4 weeks or as told by your doctor. GET HELP RIGHT AWAY IF:   You have more pain or severe pain.  You keep having nosebleeds.  The shape of your nose does not return to normal after 5 days.  You have yellowish white fluid (pus) coming from your nose.  Your nose bleeds for over 20 minutes.  Clear fluid drains from your nose.  You have a grape-like puffiness (swelling) on the inside of your nose.  You have trouble moving your eyes.  You keep throwing up (vomiting). MAKE SURE YOU:   Understand these instructions.  Will watch this condition.  Will get help right away if you are not doing well or get worse. Document Released: 07/16/2008 Document Revised: 12/30/2011 Document  Reviewed: 01/21/2011 [facility] [place] Patient Information 2015 [place], [place]. This information is not intended to replace advice given to you by your health care provider. Make sure you discuss any questions you have with your health care provider.
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
## 2014-06-16 NOTE — ED Notes (Signed)
[employer] at bedside 
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
## 2016-06-15 ENCOUNTER — Encounter (HOSPITAL_COMMUNITY): Payer: Self-pay | Admitting: Emergency Medicine

## 2016-06-15 ENCOUNTER — Emergency Department (HOSPITAL_COMMUNITY): Payer: Self-pay

## 2016-06-15 ENCOUNTER — Emergency Department (HOSPITAL_COMMUNITY)
Admission: EM | Admit: 2016-06-15 | Discharge: 2016-06-15 | Disposition: A | Discharge status: Home / Self Care | Payer: Self-pay | Attending: Emergency Medicine | Admitting: Emergency Medicine

## 2016-06-15 DIAGNOSIS — F1290 Cannabis use, unspecified, uncomplicated: Secondary | ICD-10-CM | POA: Insufficient documentation

## 2016-06-15 DIAGNOSIS — Y999 Unspecified external cause status: Secondary | ICD-10-CM | POA: Insufficient documentation

## 2016-06-15 DIAGNOSIS — Y9301 Activity, walking, marching and hiking: Secondary | ICD-10-CM | POA: Insufficient documentation

## 2016-06-15 DIAGNOSIS — Y929 Unspecified place or not applicable: Secondary | ICD-10-CM | POA: Insufficient documentation

## 2016-06-15 DIAGNOSIS — M25561 Pain in right knee: Secondary | ICD-10-CM | POA: Insufficient documentation

## 2016-06-15 DIAGNOSIS — W1809XA Striking against other object with subsequent fall, initial encounter: Secondary | ICD-10-CM | POA: Insufficient documentation

## 2016-06-15 DIAGNOSIS — F17200 Nicotine dependence, unspecified, uncomplicated: Secondary | ICD-10-CM | POA: Insufficient documentation

## 2016-06-15 MED ORDER — ACETAMINOPHEN 325 MG PO TABS
650.0000 mg | ORAL_TABLET | Freq: Once | ORAL | Status: AC
  Administered 2016-06-15: 650 mg via ORAL
  Filled 2016-06-15: qty 2

## 2016-06-15 MED ORDER — IBUPROFEN 800 MG PO TABS: 800.0000 mg | ORAL_TABLET | Freq: Three times a day (TID) | ORAL | 0 refills | Status: DC

## 2016-06-15 NOTE — ED Triage Notes (Addendum)
Pt fell and "busted his knee" yesterday. Pt c/o severe pain in R knee. Sts "I fear the worst for it." Pt A&Ox4 and ambulatory with pain. Denies numbness or tingling in leg. Pt also adds that his R ankle hurts as well.
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
## 2018-10-13 ENCOUNTER — Emergency Department (HOSPITAL_COMMUNITY): Payer: Self-pay

## 2018-10-13 ENCOUNTER — Emergency Department (HOSPITAL_COMMUNITY)
Admission: EM | Admit: 2018-10-13 | Discharge: 2018-10-13 | Disposition: A | Discharge status: Home / Self Care | Payer: Self-pay | Attending: Emergency Medicine | Admitting: Emergency Medicine

## 2018-10-13 ENCOUNTER — Encounter (HOSPITAL_COMMUNITY): Payer: Self-pay | Admitting: Emergency Medicine

## 2018-10-13 ENCOUNTER — Other Ambulatory Visit: Payer: Self-pay

## 2018-10-13 DIAGNOSIS — M25562 Pain in left knee: Secondary | ICD-10-CM | POA: Insufficient documentation

## 2018-10-13 DIAGNOSIS — T148XXA Other injury of unspecified body region, initial encounter: Secondary | ICD-10-CM

## 2018-10-13 DIAGNOSIS — Y929 Unspecified place or not applicable: Secondary | ICD-10-CM | POA: Insufficient documentation

## 2018-10-13 DIAGNOSIS — F17200 Nicotine dependence, unspecified, uncomplicated: Secondary | ICD-10-CM | POA: Insufficient documentation

## 2018-10-13 DIAGNOSIS — S63621A Sprain of interphalangeal joint of right thumb, initial encounter: Secondary | ICD-10-CM

## 2018-10-13 DIAGNOSIS — Y9355 Activity, bike riding: Secondary | ICD-10-CM | POA: Insufficient documentation

## 2018-10-13 DIAGNOSIS — S8012XA Contusion of left lower leg, initial encounter: Secondary | ICD-10-CM

## 2018-10-13 DIAGNOSIS — Y999 Unspecified external cause status: Secondary | ICD-10-CM | POA: Insufficient documentation

## 2018-10-13 DIAGNOSIS — S63601A Unspecified sprain of right thumb, initial encounter: Secondary | ICD-10-CM | POA: Insufficient documentation

## 2018-10-13 MED ORDER — IBUPROFEN 400 MG PO TABS
400.0000 mg | ORAL_TABLET | Freq: Once | ORAL | Status: AC
  Administered 2018-10-13: 400 mg via ORAL
  Filled 2018-10-13: qty 1

## 2018-10-13 NOTE — ED Notes (Signed)
Patient transported to X-ray 
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
## 2018-10-13 NOTE — Discharge Instructions (Addendum)
It was our pleasure to provide your ER care today - we hope that you feel better.  Wear thumb splint/brace for support and comfort for the next 1-2 weeks. Follow up with hand specialist in 2 weeks if thumb pain persists - call office to arrange appointment.   Take acetaminophen and/or ibuprofen as need.   Return to ER if worse, new symptoms, new or severe pain, other concern.
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
## 2018-10-13 NOTE — ED Notes (Signed)
Pt verbalized understanding of d/c instructions and has no further questions. VSS, NAD.  

## 2021-05-05 ENCOUNTER — Inpatient Hospital Stay (HOSPITAL_COMMUNITY)
Admission: EM | Admit: 2021-05-05 | Discharge: 2021-05-07 | DRG: 536 | Disposition: A | Discharge status: Home / Self Care | Payer: Self-pay | Attending: Surgery | Admitting: Surgery

## 2021-05-05 ENCOUNTER — Emergency Department (HOSPITAL_COMMUNITY): Payer: Self-pay

## 2021-05-05 DIAGNOSIS — Z23 Encounter for immunization: Secondary | ICD-10-CM

## 2021-05-05 DIAGNOSIS — S32301A Unspecified fracture of right ilium, initial encounter for closed fracture: Principal | ICD-10-CM | POA: Diagnosis present

## 2021-05-05 DIAGNOSIS — S0181XA Laceration without foreign body of other part of head, initial encounter: Secondary | ICD-10-CM | POA: Diagnosis present

## 2021-05-05 DIAGNOSIS — S0101XA Laceration without foreign body of scalp, initial encounter: Secondary | ICD-10-CM | POA: Diagnosis present

## 2021-05-05 DIAGNOSIS — T1490XA Injury, unspecified, initial encounter: Secondary | ICD-10-CM | POA: Diagnosis present

## 2021-05-05 DIAGNOSIS — J45909 Unspecified asthma, uncomplicated: Secondary | ICD-10-CM | POA: Diagnosis present

## 2021-05-05 DIAGNOSIS — F17210 Nicotine dependence, cigarettes, uncomplicated: Secondary | ICD-10-CM | POA: Diagnosis present

## 2021-05-05 DIAGNOSIS — Z20822 Contact with and (suspected) exposure to covid-19: Secondary | ICD-10-CM | POA: Diagnosis present

## 2021-05-05 DIAGNOSIS — R222 Localized swelling, mass and lump, trunk: Secondary | ICD-10-CM | POA: Diagnosis present

## 2021-05-05 LAB — I-STAT CHEM 8, ED
BUN: <3 mg/dL — ABNORMAL LOW (ref 6–20)
Calcium, Ion: 1.04 mmol/L — ABNORMAL LOW (ref 1.15–1.40)
Chloride: 100 mmol/L (ref 98–111)
Creatinine, Ser: 1.1 mg/dL (ref 0.61–1.24)
Glucose, Bld: 110 mg/dL — ABNORMAL HIGH (ref 70–99)
HCT: 46 % (ref 39.0–52.0)
Hemoglobin: 15.6 g/dL (ref 13.0–17.0)
Potassium: 4.5 mmol/L (ref 3.5–5.1)
Sodium: 137 mmol/L (ref 135–145)
TCO2: 27 mmol/L (ref 22–32)

## 2021-05-05 LAB — COMPREHENSIVE METABOLIC PANEL
ALT: 29 U/L (ref 0–44)
AST: 46 U/L — ABNORMAL HIGH (ref 15–41)
Albumin: 3.8 g/dL (ref 3.5–5.0)
Alkaline Phosphatase: 66 U/L (ref 38–126)
Anion gap: 14 (ref 5–15)
BUN: <5 mg/dL — ABNORMAL LOW (ref 6–20)
CO2: 24 mmol/L (ref 22–32)
Calcium: 8.6 mg/dL — ABNORMAL LOW (ref 8.9–10.3)
Chloride: 101 mmol/L (ref 98–111)
Creatinine, Ser: 1 mg/dL (ref 0.61–1.24)
GFR, Estimated: >60 mL/min (ref >60)
Glucose, Bld: 120 mg/dL — ABNORMAL HIGH (ref 70–99)
Potassium: 4 mmol/L (ref 3.5–5.1)
Sodium: 139 mmol/L (ref 135–145)
Total Bilirubin: 0.7 mg/dL (ref 0.3–1.2)
Total Protein: 6.3 g/dL — ABNORMAL LOW (ref 6.5–8.1)

## 2021-05-05 LAB — LACTIC ACID, PLASMA: Lactic Acid, Venous: 4.8 mmol/L (ref 0.5–1.9)

## 2021-05-05 LAB — PROTIME-INR
INR: 0.9 (ref 0.8–1.2)
Prothrombin Time: 12.5 seconds (ref 11.4–15.2)

## 2021-05-05 LAB — RESP PANEL BY RT-PCR (FLU A&B, COVID) ARPGX2
Influenza A by PCR: NEGATIVE
Influenza B by PCR: NEGATIVE
SARS Coronavirus 2 by RT PCR: NEGATIVE

## 2021-05-05 LAB — SAMPLE TO BLOOD BANK

## 2021-05-05 LAB — ETHANOL: Alcohol, Ethyl (B): 135 mg/dL — ABNORMAL HIGH (ref <10)

## 2021-05-05 MED ORDER — ONDANSETRON 4 MG PO TBDP: 4.0000 mg | ORAL_TABLET | Freq: Four times a day (QID) | ORAL | Status: DC | PRN

## 2021-05-05 MED ORDER — OXYCODONE HCL 5 MG PO TABS
5.0000 mg | ORAL_TABLET | ORAL | Status: DC | PRN
  Administered 2021-05-06: 5 mg via ORAL
  Filled 2021-05-05: qty 1

## 2021-05-05 MED ORDER — ALBUTEROL SULFATE (2.5 MG/3ML) 0.083% IN NEBU
2.5000 mg | INHALATION_SOLUTION | Freq: Three times a day (TID) | RESPIRATORY_TRACT | Status: DC | PRN
  Administered 2021-05-06 – 2021-05-07 (×2): 2.5 mg via RESPIRATORY_TRACT
  Filled 2021-05-05 (×2): qty 3

## 2021-05-05 MED ORDER — OXYCODONE HCL 5 MG PO TABS
10.0000 mg | ORAL_TABLET | ORAL | Status: DC | PRN
Start: 2021-05-05 — End: 2021-05-07
  Administered 2021-05-05 – 2021-05-06 (×2): 10 mg via ORAL
  Filled 2021-05-05 (×2): qty 2

## 2021-05-05 MED ORDER — ENOXAPARIN SODIUM 30 MG/0.3ML IJ SOSY
30.0000 mg | PREFILLED_SYRINGE | Freq: Two times a day (BID) | INTRAMUSCULAR | Status: DC
  Administered 2021-05-06 (×2): 30 mg via SUBCUTANEOUS
  Filled 2021-05-05 (×3): qty 0.3

## 2021-05-05 MED ORDER — CEFAZOLIN SODIUM-DEXTROSE 2-4 GM/100ML-% IV SOLN
2.0000 g | Freq: Once | INTRAVENOUS | Status: AC
  Administered 2021-05-05: 2 g via INTRAVENOUS

## 2021-05-05 MED ORDER — IOHEXOL 300 MG/ML  SOLN
100.0000 mL | Freq: Once | INTRAMUSCULAR | Status: AC | PRN
  Administered 2021-05-05: 100 mL via INTRAVENOUS

## 2021-05-05 MED ORDER — ONDANSETRON HCL 4 MG/2ML IJ SOLN: 4.0000 mg | Freq: Four times a day (QID) | INTRAMUSCULAR | Status: DC | PRN

## 2021-05-05 MED ORDER — SODIUM CHLORIDE 0.9 % IV SOLN: INTRAVENOUS | Status: DC

## 2021-05-05 MED ORDER — TETANUS-DIPHTH-ACELL PERTUSSIS 5-2.5-18.5 LF-MCG/0.5 IM SUSY
0.5000 mL | PREFILLED_SYRINGE | Freq: Once | INTRAMUSCULAR | Status: AC
  Administered 2021-05-05: 0.5 mL via INTRAMUSCULAR

## 2021-05-05 MED ORDER — ACETAMINOPHEN 500 MG PO TABS
1000.0000 mg | ORAL_TABLET | Freq: Four times a day (QID) | ORAL | Status: DC
  Administered 2021-05-05 – 2021-05-07 (×8): 1000 mg via ORAL
  Filled 2021-05-05 (×8): qty 2

## 2021-05-05 MED ORDER — HYDROMORPHONE HCL 1 MG/ML IJ SOLN
0.5000 mg | INTRAMUSCULAR | Status: DC | PRN
  Administered 2021-05-05: 0.5 mg via INTRAVENOUS
  Filled 2021-05-05: qty 1

## 2021-05-05 MED ORDER — DOCUSATE SODIUM 100 MG PO CAPS
100.0000 mg | ORAL_CAPSULE | Freq: Two times a day (BID) | ORAL | Status: DC
  Administered 2021-05-05 – 2021-05-07 (×4): 100 mg via ORAL
  Filled 2021-05-05 (×4): qty 1

## 2021-05-05 MED ORDER — LIDOCAINE HCL 2 % IJ SOLN
20.0000 mL | Freq: Once | INTRAMUSCULAR | Status: AC
  Administered 2021-05-05: 400 mg
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
## 2021-05-05 NOTE — Progress Notes (Signed)
Patient arrived to 8X78, AX0X4, able to make needs known. No SOB/DOB 94%room air. VSS BP109/82 T98.9 PR94 RR20. c/o 9/10 R hip pain-PRN med given. Laceration/abrasions noted-assessed/cleansed. Patient was oriented to call bell and surroundings. Call bell within reach and will continue to monitor.
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
## 2021-05-05 NOTE — Progress Notes (Signed)
Orthopedic Tech Progress Note Patient Details:  Kent Spencer 10/21/1873 [id number] Level 1 Trauma  Patient ID: Kent Spencer, male   DOB: 10/21/1873, 45 y.o.   MRN: [id number]  Smitty Pluck 05/05/2021, 4:37 PM
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
## 2021-05-05 NOTE — ED Notes (Signed)
Per [facility] clothing not needed as evidence, cut clothing, shoes, and cell phone given to patient.

## 2021-05-05 NOTE — TOC CAGE-AID Note (Signed)
Transition of Care [facility]) - CAGE-AID Screening   Patient Details  Name: Kent Spencer MRN: [id number] Date of Birth: Sep 20, 1976  Clinical Narrative:  Patient states he does drink alcohol, about 1-2 beer a day, but nothing "real heavy". Per patient, "it's not like a problem or anything". States he does not need resources at this time.  CAGE-AID Screening:    Have You Ever Felt You Ought to Cut Down on Your Drinking or Drug Use?: No Have People Annoyed You By Critizing Your Drinking Or Drug Use?: No Have You Felt Bad Or Guilty About Your Drinking Or Drug Use?: No Have You Ever Had a Drink or Used Drugs First Thing In The Morning to Steady Your Nerves or to Get Rid of a Hangover?: No CAGE-AID Score: 0  Substance Abuse Education Offered: Yes
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
## 2021-05-07 ENCOUNTER — Other Ambulatory Visit (HOSPITAL_COMMUNITY): Payer: Self-pay

## 2021-05-07 MED ORDER — HYDROMORPHONE HCL 1 MG/ML IJ SOLN
0.5000 mg | INTRAMUSCULAR | Status: DC | PRN
  Administered 2021-05-07: 0.5 mg via INTRAVENOUS
  Filled 2021-05-07: qty 0.5

## 2021-05-07 MED ORDER — ACETAMINOPHEN 500 MG PO TABS
1000.0000 mg | ORAL_TABLET | Freq: Four times a day (QID) | ORAL | 0 refills | Status: AC | PRN
  Filled 2021-05-07: qty 40, 5d supply, fill #0

## 2021-05-07 MED ORDER — OXYCODONE HCL 5 MG PO TABS
5.0000 mg | ORAL_TABLET | Freq: Four times a day (QID) | ORAL | 0 refills | Status: AC | PRN
  Filled 2021-05-07: qty 20, 5d supply, fill #0

## 2021-05-07 MED ORDER — DOCUSATE SODIUM 100 MG PO CAPS
100.0000 mg | ORAL_CAPSULE | Freq: Two times a day (BID) | ORAL | 0 refills | Status: AC | PRN
  Filled 2021-05-07: qty 10, 5d supply, fill #0
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
## 2021-05-09 DIAGNOSIS — S32301A Unspecified fracture of right ilium, initial encounter for closed fracture: Secondary | ICD-10-CM | POA: Diagnosis present

## 2021-05-09 DIAGNOSIS — S0101XA Laceration without foreign body of scalp, initial encounter: Secondary | ICD-10-CM | POA: Diagnosis present

## 2021-05-09 DIAGNOSIS — R222 Localized swelling, mass and lump, trunk: Secondary | ICD-10-CM | POA: Diagnosis present

## 2021-05-09 DIAGNOSIS — T1490XA Injury, unspecified, initial encounter: Secondary | ICD-10-CM | POA: Diagnosis present

## 2021-06-20 NOTE — Progress Notes (Signed)
Patient did not show for appointment.   

## 2021-06-21 ENCOUNTER — Other Ambulatory Visit: Payer: Self-pay

## 2021-06-22 ENCOUNTER — Encounter: Payer: Self-pay | Admitting: Family

## 2021-06-22 DIAGNOSIS — R222 Localized swelling, mass and lump, trunk: Secondary | ICD-10-CM

## 2021-06-22 DIAGNOSIS — T1490XA Injury, unspecified, initial encounter: Secondary | ICD-10-CM

## 2021-06-22 DIAGNOSIS — Z09 Encounter for follow-up examination after completed treatment for conditions other than malignant neoplasm: Secondary | ICD-10-CM

## 2021-06-22 DIAGNOSIS — Z7689 Persons encountering health services in other specified circumstances: Secondary | ICD-10-CM

## 2023-04-28 ENCOUNTER — Ambulatory Visit: Payer: Medicaid Other | Admitting: Internal Medicine
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
## 2024-11-16 ENCOUNTER — Emergency Department (HOSPITAL_COMMUNITY)

## 2024-11-16 ENCOUNTER — Encounter (HOSPITAL_COMMUNITY): Payer: Self-pay

## 2024-11-16 ENCOUNTER — Other Ambulatory Visit: Payer: Self-pay

## 2024-11-16 ENCOUNTER — Emergency Department (HOSPITAL_COMMUNITY)
Admission: EM | Admit: 2024-11-16 | Discharge: 2024-11-16 | Disposition: A | Discharge status: Home / Self Care | Attending: Emergency Medicine | Admitting: Emergency Medicine

## 2024-11-16 DIAGNOSIS — S40012A Contusion of left shoulder, initial encounter: Secondary | ICD-10-CM | POA: Diagnosis not present

## 2024-11-16 DIAGNOSIS — W19XXXA Unspecified fall, initial encounter: Secondary | ICD-10-CM | POA: Diagnosis not present

## 2024-11-16 DIAGNOSIS — J45909 Unspecified asthma, uncomplicated: Secondary | ICD-10-CM | POA: Insufficient documentation

## 2024-11-16 DIAGNOSIS — S4992XA Unspecified injury of left shoulder and upper arm, initial encounter: Secondary | ICD-10-CM | POA: Diagnosis present

## 2024-11-16 HISTORY — DX: Unspecified asthma, uncomplicated: J45.909

## 2024-11-16 MED ORDER — CYCLOBENZAPRINE HCL 10 MG PO TABS: 10.0000 mg | ORAL_TABLET | Freq: Two times a day (BID) | ORAL | 0 refills | Status: AC | PRN

## 2024-11-16 MED ORDER — IBUPROFEN 600 MG PO TABS: 600.0000 mg | ORAL_TABLET | Freq: Four times a day (QID) | ORAL | 0 refills | Status: AC | PRN

## 2024-11-16 MED ORDER — IPRATROPIUM-ALBUTEROL 0.5-2.5 (3) MG/3ML IN SOLN
3.0000 mL | Freq: Once | RESPIRATORY_TRACT | Status: AC
  Administered 2024-11-16: 3 mL via RESPIRATORY_TRACT
  Filled 2024-11-16: qty 3

## 2024-11-16 MED ORDER — IBUPROFEN 800 MG PO TABS
800.0000 mg | ORAL_TABLET | Freq: Once | ORAL | Status: AC
  Administered 2024-11-16: 800 mg via ORAL
  Filled 2024-11-16: qty 1

## 2024-11-16 MED ORDER — ALBUTEROL SULFATE HFA 108 (90 BASE) MCG/ACT IN AERS
INHALATION_SPRAY | RESPIRATORY_TRACT | Status: AC
  Filled 2024-11-16: qty 6.7

## 2024-11-16 MED ORDER — ALBUTEROL SULFATE HFA 108 (90 BASE) MCG/ACT IN AERS
2.0000 | INHALATION_SPRAY | RESPIRATORY_TRACT | Status: DC | PRN
  Administered 2024-11-16: 2 via RESPIRATORY_TRACT
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
## 2024-11-16 NOTE — ED Notes (Signed)
 Pt provided with discharge and follow up instructions, medications discussed, pt verbalized understanding. Pt provided with bus pass, VSS, pt ambulatory out of ED w/ all paperwork and belongings in NAD.
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
## 2024-11-16 NOTE — ED Provider Notes (Signed)
 " [place] [facility] Provider Note   CSN: [id number] Arrival date & time: 11/16/24  1619     Patient presents with: Fall and Shoulder Injury   Kent Spencer is a 49 y.o. male.   The history is provided by the patient and medical records. No language interpreter was used.  Fall  Shoulder Injury     49 year old male with prior history of clavicle injury presenting for evaluation of a fall.  Patient states several days ago he fell and landed on his left shoulder.  Since then he has noticed bruising about the shoulder as well as decreased shoulder range of motion secondary to pain.  He has injured his left clavicle in the past.  He denies hitting his head or loss of consciousness.  He also have history of asthma but denies any increased shortness of breath denies any splint chest pain no numbness no weakness no elbow pain.  No specific treatment tried.  Prior to Admission medications  Medication Sig Start Date End Date Taking? Authorizing Provider  docusate sodium  (COLACE) 100 MG capsule Take 1 capsule (100 mg total) by mouth 2 (two) times daily as needed for mild constipation. 05/07/21   Rosalba Glendale DEL, PA-C  ibuprofen  (ADVIL ,MOTRIN ) 800 MG tablet Take 1 tablet (800 mg total) by mouth 3 (three) times daily. 06/15/16   Nivia Colon, PA-C    Allergies: Shellfish allergy and Shrimp [shellfish allergy]    Review of Systems  All other systems reviewed and are negative.   Updated Vital Signs BP 120/82   Pulse 86   Temp 98.3 F (36.8 C)   Resp 19   Ht 5' 2 (1.575 m)   Wt 54.4 kg   SpO2 100%   BMI 21.94 kg/m   Physical Exam Constitutional:      General: He is not in acute distress.    Appearance: He is well-developed.  HENT:     Head: Atraumatic.  Eyes:     Conjunctiva/sclera: Conjunctivae normal.  Pulmonary:     Breath sounds: Wheezing present.  Musculoskeletal:        General: Signs of injury (Left shoulder: Tenderness about the  lateral deltoid and at the A/C joint as well as the glenohumeral joint on palpation.  Decreased shoulder range of motion.  Surrounding ecchymosis noted expanding towards the left upper outer chest without any crepitus) present.     Cervical back: Normal range of motion and neck supple.  Skin:    Findings: No rash.  Neurological:     Mental Status: He is alert.     (all labs ordered are listed, but only abnormal results are displayed) Labs Reviewed - No data to display  EKG: None  Radiology: DG Clavicle Left Result Date: 11/16/2024 EXAM: 2 VIEW(S) XRAY OF THE LEFT CLAVICLE COMPLETE 11/16/2024 05:53:13 PM COMPARISON: None available. CLINICAL HISTORY: Injury. FINDINGS: BONES: Posttraumatic deformity of the remote left mid clavicle shaft fracture. No acute fracture. No malalignment. JOINTS: No joint dislocation. SOFT TISSUES: Unremarkable. IMPRESSION: 1. No acute fracture or dislocation. Electronically signed by: Rogelia Myers MD 11/16/2024 06:34 PM EST RP Workstation: [id number]   DG Shoulder Left Result Date: 11/16/2024 EXAM: 1 VIEW(S) XRAY OF THE LEFT SHOULDER 11/16/2024 05:53:13 PM COMPARISON: None available. CLINICAL HISTORY: Swelling, bruising, injury. FINDINGS: BONES AND JOINTS: Glenohumeral joint is normally aligned. No acute fracture. No malalignment. [facility] joint is unremarkable. Posttraumatic deformity of the remote mid left clavicular shaft fracture. SOFT TISSUES: No abnormal calcifications.  Visualized lung is unremarkable. IMPRESSION: 1. No acute fracture or dislocation. Electronically signed by: Rogelia Myers MD 11/16/2024 06:31 PM EST RP Workstation: [id number]     Procedures   Medications Ordered in the ED  ipratropium-albuterol  (DUONEB) 0.5-2.5 (3) MG/3ML nebulizer solution 3 mL (has no administration in time range)  ibuprofen  (ADVIL ) tablet 800 mg (has no administration in time range)                                    Medical Decision Making Amount and/or  Complexity of Data Reviewed Radiology: ordered.   BP 120/82   Pulse 86   Temp 98.3 F (36.8 C)   Resp 19   Ht 5' 2 (1.575 m)   Wt 54.4 kg   SpO2 100%   BMI 21.94 kg/m   34:74 PM  49 year old male with prior history of clavicle injury presenting for evaluation of a fall.  Patient states several days ago he fell and landed on his left shoulder.  Since then he has noticed bruising about the shoulder as well as decreased shoulder range of motion secondary to pain.  He has injured his left clavicle in the past.  He denies hitting his head or loss of consciousness.  He also have history of asthma but denies any increased shortness of breath denies any splint chest pain no numbness no weakness no elbow pain.  No specific treatment tried.  On exam patient has tenderness about his left shoulder and left upper chest wall.  He does have surrounding contusion to the skin that appears to be subacute as the bruising appears to be several days ago.  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  X-ray of the left shoulder and left clavicle was obtained independent viewed inter by me and fortunately no evidence of acute fracture or dislocation.  Will provide patient with a sling for support, ibuprofen  given for pain with improvement of symptoms.  Patient was found to be wheezing and he mention this is chronic for him and he is not having increased shortness of breath.  Will provide albuterol  inhaler to use as needed.  RICE therapy discussed.  Patient stable for discharge.     Final diagnoses:  Contusion of left shoulder, initial encounter    ED Discharge Orders          Ordered    ibuprofen  (ADVIL ) 600 MG tablet  Every 6 hours PRN        11/16/24 1958    cyclobenzaprine  (FLEXERIL ) 10 MG tablet  2 times daily PRN        11/16/24 1958               Nivia Colon, PA-C 11/16/24 1959    Tegeler, Lonni PARAS, MD 11/16/24 [id number]  "

## 2024-11-16 NOTE — ED Notes (Signed)
Ortho tech called for sling application

## 2024-11-16 NOTE — Discharge Instructions (Addendum)
 You have been evaluated for your injury.  Fortunately no evidence of any broken bone or dislocation of your shoulder or your clavicle.  You may wear the sling as needed for support but do not wear it for more than 3 to 4 hours at a time and be sure to move your shoulder to decrease frozen shoulder.  You may alternate between heat and ice to the affected area for comfort.  Take ibuprofen  and muscle relaxant as needed for pain.  Use albuterol  inhaler as needed for shortness of breath.
